# Patient Record
Sex: Female | Born: 1935 | Race: White | Hispanic: No | Marital: Single | Smoking: Former smoker
Health system: Southern US, Community
[De-identification: ages and names within clinical notes are randomized; demographics above are authoritative.]

## PROBLEM LIST (undated history)

## (undated) DIAGNOSIS — I82409 Acute embolism and thrombosis of unspecified deep veins of unspecified lower extremity: Secondary | ICD-10-CM

## (undated) DIAGNOSIS — B029 Zoster without complications: Secondary | ICD-10-CM

## (undated) DIAGNOSIS — E7800 Pure hypercholesterolemia, unspecified: Secondary | ICD-10-CM

## (undated) DIAGNOSIS — I509 Heart failure, unspecified: Secondary | ICD-10-CM

## (undated) HISTORY — PX: COLON SURGERY: SHX602

## (undated) HISTORY — PX: THROAT SURGERY: SHX803

## (undated) HISTORY — PX: MASTECTOMY: SHX3

---

## 2014-10-30 DIAGNOSIS — B029 Zoster without complications: Secondary | ICD-10-CM

## 2014-10-30 HISTORY — DX: Zoster without complications: B02.9

## 2016-06-12 ENCOUNTER — Emergency Department (HOSPITAL_COMMUNITY): Payer: Medicare PPO

## 2016-06-12 ENCOUNTER — Encounter (HOSPITAL_COMMUNITY): Payer: Self-pay | Admitting: Emergency Medicine

## 2016-06-12 ENCOUNTER — Emergency Department (HOSPITAL_COMMUNITY)
Admission: EM | Admit: 2016-06-12 | Discharge: 2016-06-12 | Disposition: A | Discharge status: Home / Self Care | Payer: Medicare PPO | Attending: Emergency Medicine | Admitting: Emergency Medicine

## 2016-06-12 DIAGNOSIS — S0990XA Unspecified injury of head, initial encounter: Secondary | ICD-10-CM | POA: Diagnosis present

## 2016-06-12 DIAGNOSIS — Y999 Unspecified external cause status: Secondary | ICD-10-CM | POA: Insufficient documentation

## 2016-06-12 DIAGNOSIS — I509 Heart failure, unspecified: Secondary | ICD-10-CM | POA: Diagnosis not present

## 2016-06-12 DIAGNOSIS — Z7901 Long term (current) use of anticoagulants: Secondary | ICD-10-CM | POA: Insufficient documentation

## 2016-06-12 DIAGNOSIS — Y9389 Activity, other specified: Secondary | ICD-10-CM | POA: Insufficient documentation

## 2016-06-12 DIAGNOSIS — Y929 Unspecified place or not applicable: Secondary | ICD-10-CM | POA: Insufficient documentation

## 2016-06-12 DIAGNOSIS — Z87891 Personal history of nicotine dependence: Secondary | ICD-10-CM | POA: Diagnosis not present

## 2016-06-12 DIAGNOSIS — S0083XA Contusion of other part of head, initial encounter: Secondary | ICD-10-CM | POA: Diagnosis not present

## 2016-06-12 DIAGNOSIS — Z79899 Other long term (current) drug therapy: Secondary | ICD-10-CM | POA: Insufficient documentation

## 2016-06-12 DIAGNOSIS — W07XXXA Fall from chair, initial encounter: Secondary | ICD-10-CM | POA: Diagnosis not present

## 2016-06-12 HISTORY — DX: Zoster without complications: B02.9

## 2016-06-12 HISTORY — DX: Acute embolism and thrombosis of unspecified deep veins of unspecified lower extremity: I82.409

## 2016-06-12 HISTORY — DX: Heart failure, unspecified: I50.9

## 2016-06-12 HISTORY — DX: Pure hypercholesterolemia, unspecified: E78.00

## 2016-06-12 NOTE — ED Notes (Signed)
Assisted pt to bedside commode in room. Pt normally ambulatory w/ walker or assistance but does c/o new right foot pain when attempting to bear weight while ambulating.

## 2016-06-12 NOTE — ED Triage Notes (Signed)
Brought by EMS from home after falling forward from recliner chair.  Landed face first on hardwood floor.  Hematoma noted to forehead.  Denies any pain.  Pt is on coumadin.  [id number].

## 2016-06-12 NOTE — ED Provider Notes (Signed)
[facility]-EMERGENCY DEPT Provider Note   CSN: [id number] Arrival date & time: 06/12/16  [id number]  First Provider Contact:  First MD Initiated Contact with Patient 06/12/16 [phone]        History   Chief Complaint Chief Complaint  Patient presents with  . Fall    HPI Olivia Beasley is a 82 y.o. female.  HPI   82 year old female presenting after mechanical fall. She is getting up from her recliner this morning. She leaned forward and then subsequently fell forward. She discharge her head. No loss of consciousness. She does have a hematoma to her forehead. She denies any significant pain though. She is on Coumadin for history of DVT. No acute neurological complaints. No nausea or vomiting. Her daughter is at bedside and reports that patient is at baseline mental status. Past Medical History:  Diagnosis Date  . CHF (congestive heart failure) (HCC)   . DVT (deep venous thrombosis) (HCC)   . Hypercholesterolemia   . Shingles 2016    There are no active problems to display for this patient.   Past Surgical History:  Procedure Laterality Date  . COLON SURGERY     remove diverticulum  . MASTECTOMY Left   . THROAT SURGERY      OB History    No data available       Home Medications    Prior to Admission medications   Medication Sig Start Date End Date Taking? Authorizing Provider  carbidopa-levodopa (SINEMET IR) 25-100 MG tablet Take 1 tablet by mouth 2 (two) times daily. 05/01/16  Yes Historical Provider, MD  furosemide (LASIX) 20 MG tablet Take 20 mg by mouth daily. 05/21/16  Yes Historical Provider, MD  lidocaine (LIDODERM) 5 % Place 1 patch onto the skin daily. Remove & Discard patch within 12 hours or as directed by MD   Yes Historical Provider, MD  LYRICA 50 MG capsule Take 50 mg by mouth daily. 06/06/16  Yes Historical Provider, MD  Multiple Vitamins-Minerals (PRESERVISION AREDS 2 PO) Take 1 tablet by mouth daily.   Yes Historical Provider, MD  oxyCODONE-acetaminophen  (PERCOCET/ROXICET) 5-325 MG tablet Take 1 tablet by mouth every 6 (six) hours as needed for moderate pain.  05/26/16  Yes Historical Provider, MD  potassium chloride (K-DUR) 10 MEQ tablet Take 10 mEq by mouth daily. 05/22/16  Yes Historical Provider, MD  SPIRIVA RESPIMAT 2.5 MCG/ACT AERS Inhale 1 puff into the lungs daily.  05/25/16  Yes Historical Provider, MD  warfarin (COUMADIN) 2 MG tablet Take 3 mg by mouth daily. 05/27/16  Yes Historical Provider, MD    Family History No family history on file.  Social History Social History  Substance Use Topics  . Smoking status: Former Smoker    Years: 12.00  . Smokeless tobacco: Not on file  . Alcohol use No     Allergies   Gabapentin and Penicillins   Review of Systems Review of Systems  All systems reviewed and negative, other than as noted in HPI.  Physical Exam Updated Vital Signs BP 116/60   Pulse 79   Temp 97.9 F (36.6 C) (Oral)   Resp 21   Ht 5' (1.524 m)   Wt 154 lb (69.9 kg)   SpO2 95%   BMI 30.08 kg/m   Physical Exam  Constitutional: She appears well-developed and well-nourished. No distress.  HENT:  Head: Normocephalic.  Hematoma to left forehead. No scalp or facial tenderness otherwise.  Eyes: Conjunctivae are normal.  Neck: Neck supple.  Cardiovascular: Normal rate  and regular rhythm.   No murmur heard. Pulmonary/Chest: Effort normal and breath sounds normal. No respiratory distress.  Abdominal: Soft. There is no tenderness.  Musculoskeletal: She exhibits no edema.  No midline spinal tenderness.  Neurological: She is alert.  Skin: Skin is warm and dry.  Stasis changes to bilateral shins.  Psychiatric: She has a normal mood and affect.  Nursing note and vitals reviewed.    ED Treatments / Results  Labs (all labs ordered are listed, but only abnormal results are displayed) Labs Reviewed - No data to display  EKG  EKG Interpretation None       Radiology Ct Head Wo Contrast  Result Date:  06/12/2016 CLINICAL DATA:  Fall out of recliner, struck head on hardwood floor, forehead hematoma, on Coumadin EXAM: CT HEAD WITHOUT CONTRAST TECHNIQUE: Contiguous axial images were obtained from the base of the skull through the vertex without intravenous contrast. COMPARISON:  None. FINDINGS: No evidence of parenchymal hemorrhage or extra-axial fluid collection. No mass lesion, mass effect, or midline shift. No CT evidence of acute infarction. Subcortical white matter and periventricular small vessel ischemic changes. Mild cortical atrophy.  No ventriculomegaly. The visualized paranasal sinuses are essentially clear. The mastoid air cells are unopacified. Mild soft tissue swelling/hematoma overlying the left frontal bone (series 2/ image 20). No evidence of calvarial fracture. IMPRESSION: Mild soft tissue swelling/hematoma overlying the left frontal bone. No evidence of calvarial fracture. No evidence of acute intracranial abnormality. Atrophy with small vessel ischemic changes. Electronically Signed   By: Charline BillsSriyesh  Krishnan M.D.   On: 06/12/2016 08:33    Procedures Procedures (including critical care time)  Medications Ordered in ED Medications - No data to display   Initial Impression / Assessment and Plan / ED Course  I have reviewed the triage vital signs and the nursing notes.  Pertinent labs & imaging results that were available during my care of the patient were reviewed by me and considered in my medical decision making (see chart for details).  Clinical Course   82 year old female presented after mechanical fall. Discharge her head. No LOC. Reassuring exam. No acute neurological complaints. She is on Coumadin though, so CT the head was done. Does not show any bony abnormality or evidence of acute intracranial injury. I feel she is stable for discharge at this time. Return precautions discussed with patient and daughter.  Final Clinical Impressions(s) / ED Diagnoses   Final diagnoses:    Facial contusion, initial encounter  Anticoagulated    New Prescriptions New Prescriptions   No medications on file     Raeford RazorStephen Hulbert Branscome, MD 06/12/16 [phone]

## 2017-04-29 DEATH — deceased

## 2018-03-27 IMAGING — CT CT HEAD W/O CM
4 series · 16 of 47 positions shown, 18 images · non-contrast
Comparison: None.

CLINICAL DATA: Fall out of recliner, struck head on hardwood floor,
forehead hematoma, on Coumadin

EXAM:
CT HEAD WITHOUT CONTRAST
TECHNIQUE: Contiguous axial images were obtained from the base of the skull
through the vertex without intravenous contrast.

[Series 2: head without · axial · non-contrast · 0.44mm/px · z∈[-180,-65]mm · 7 of 31 slices shown, 9 images]
[im 4/31  brain]
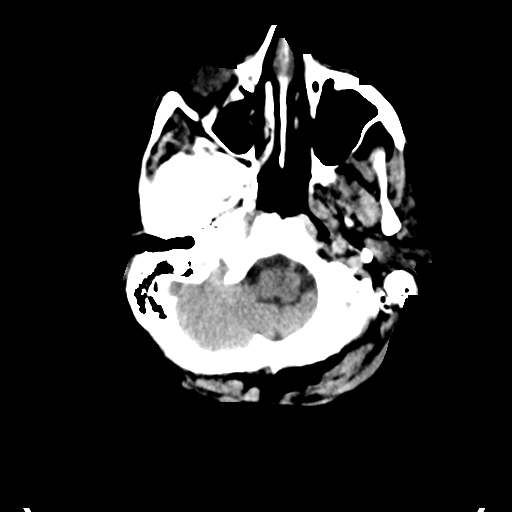
[im 4/31  bone]
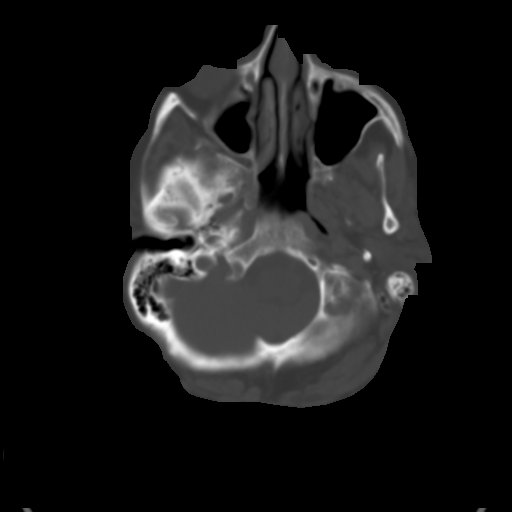
[im 8/31  brain]
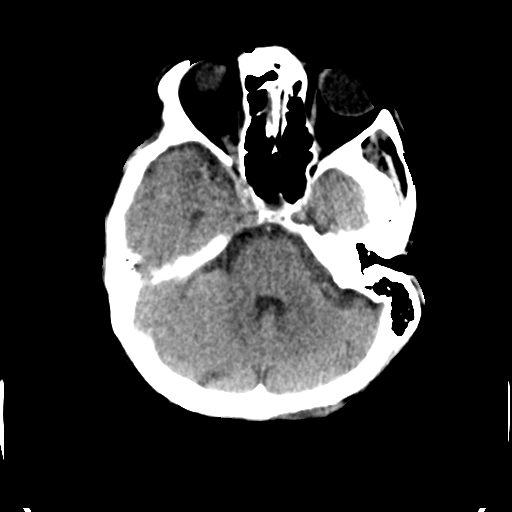
[im 12/31  brain]
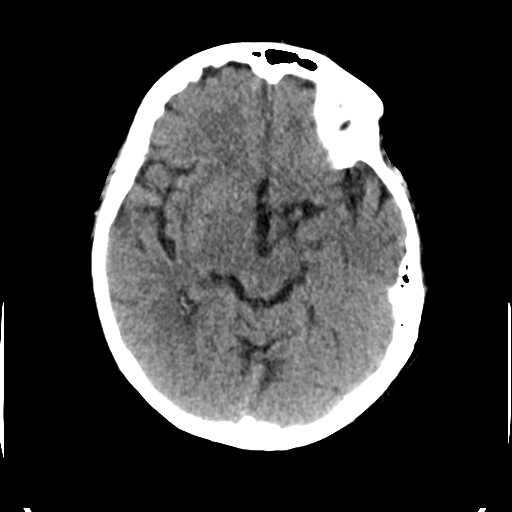
[im 16/31  brain]
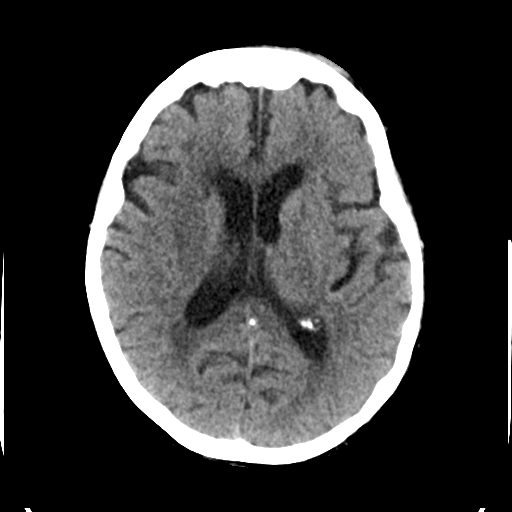
[im 19/31  brain]
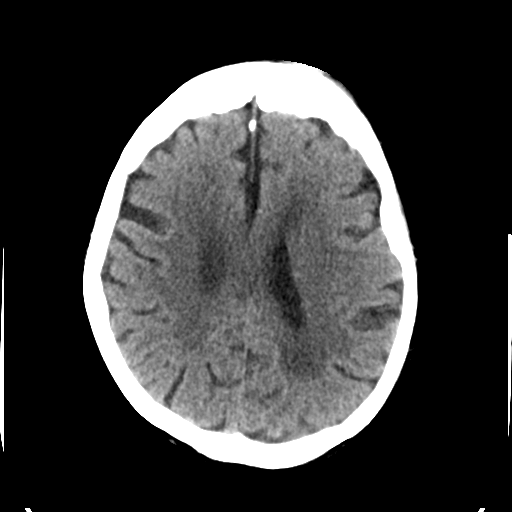
[im 19/31  bone]
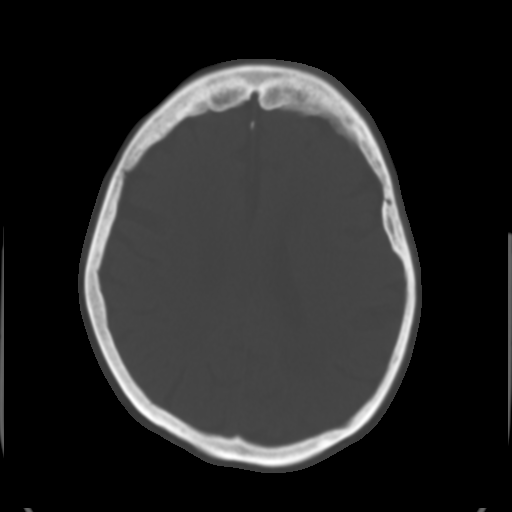
[im 23/31  brain]
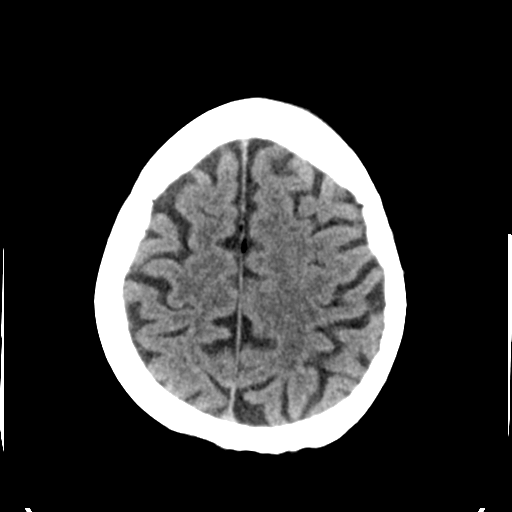
[im 27/31  brain]
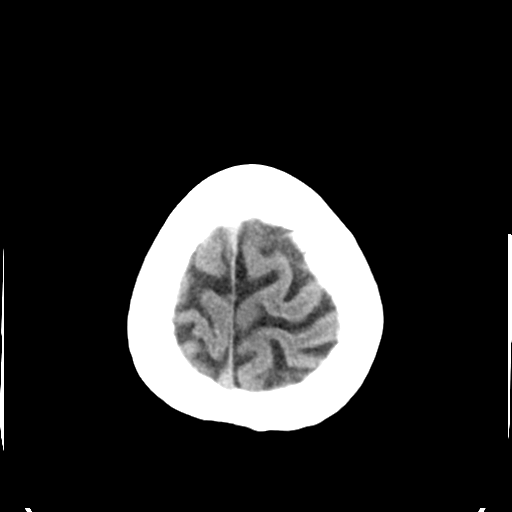

[Series 3: head bone · axial · 0.44mm/px · z∈[-181,-149]mm · 3 of 78 slices shown]
[im 8/78  bone]
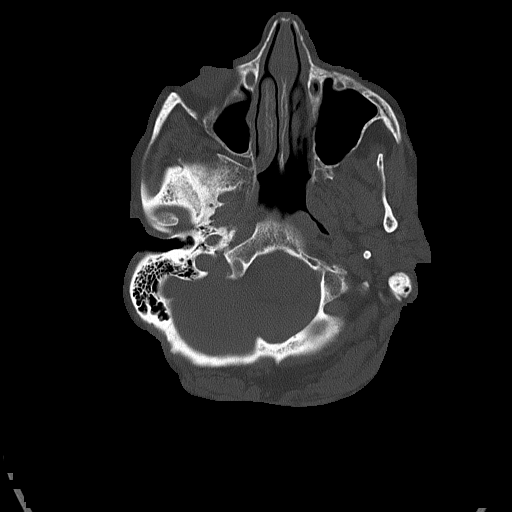
[im 16/78  bone]
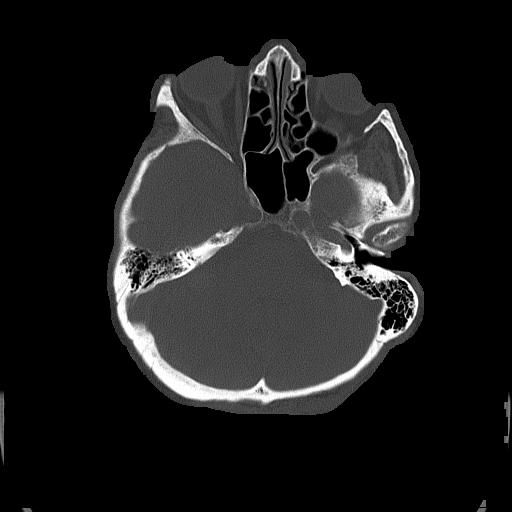
[im 24/78  bone]
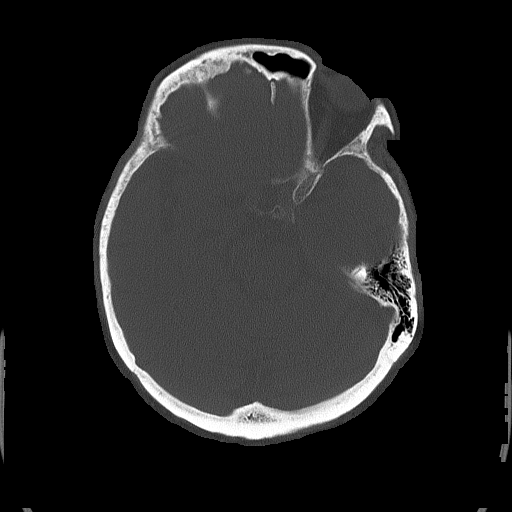

[Series 5: head without sag · sagittal · non-contrast · 0.32mm/px · 3 of 50 slices shown]
[im 17/50  brain]
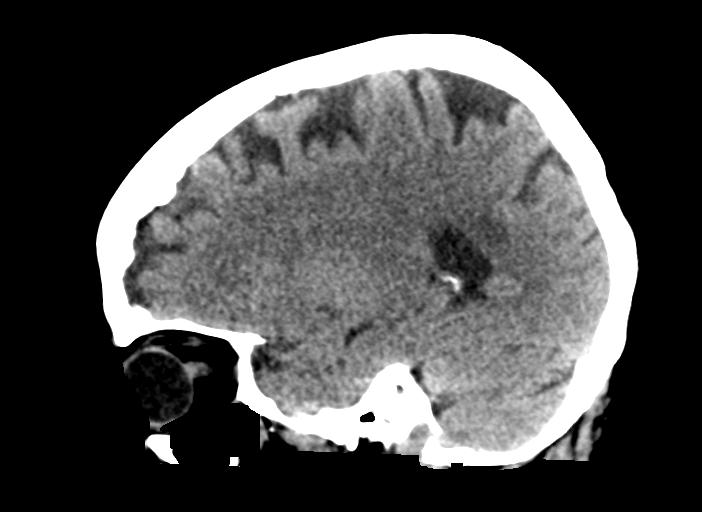
[im 25/50  brain]
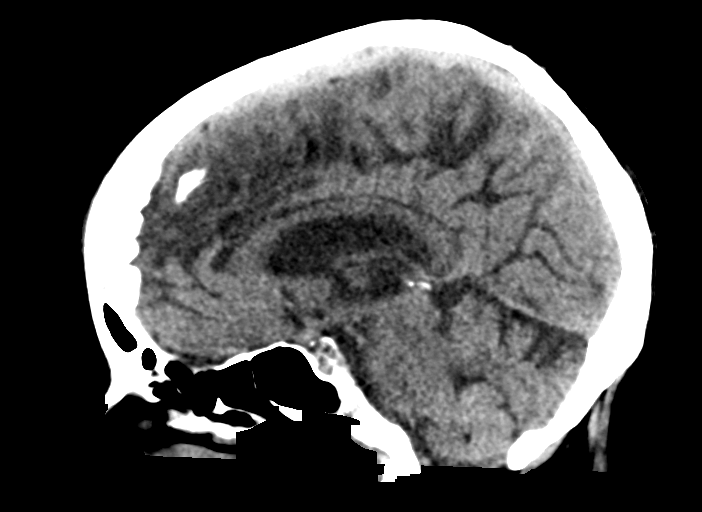
[im 33/50  brain]
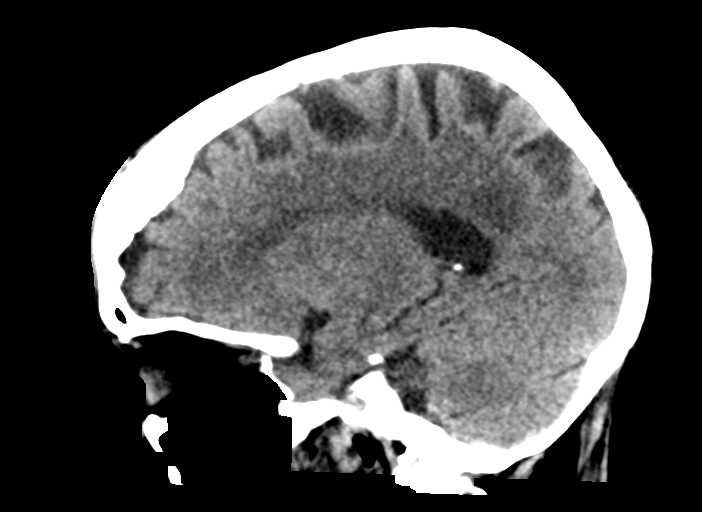

[Series 6: head without cor · coronal · non-contrast · 0.32mm/px · 3 of 62 slices shown]
[im 21/62  brain]
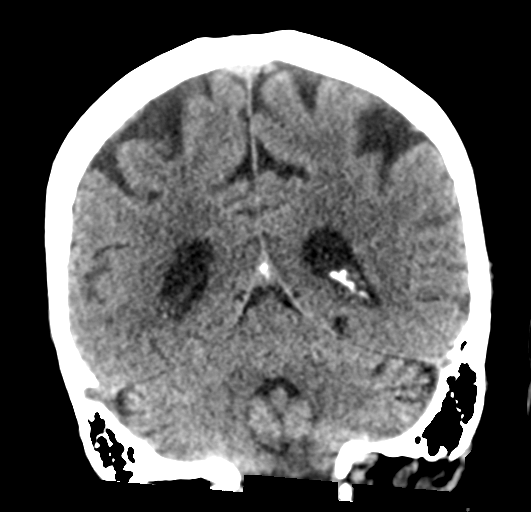
[im 28/62  brain]
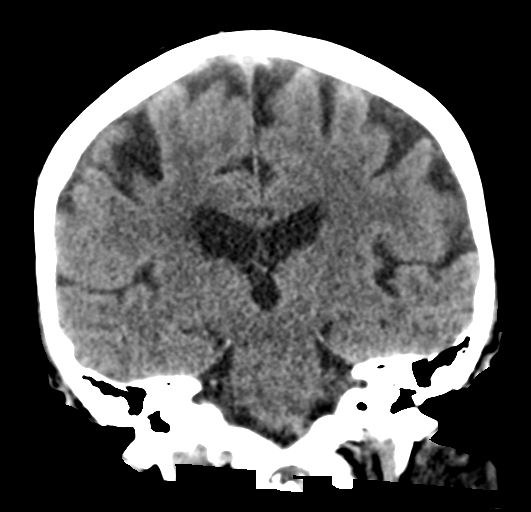
[im 34/62  brain]
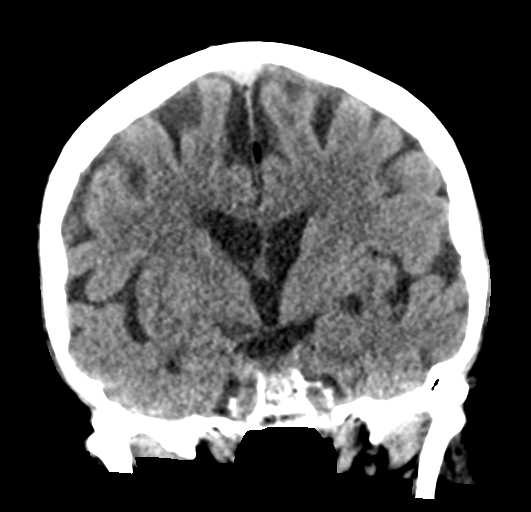

[16 of 47 positions shown; findings below may reference images not displayed]

FINDINGS: No evidence of parenchymal hemorrhage or extra-axial fluid
collection. No mass lesion, mass effect, or midline shift.

No CT evidence of acute infarction.

Subcortical white matter and periventricular small vessel ischemic
changes.

Mild cortical atrophy.  No ventriculomegaly.

The visualized paranasal sinuses are essentially clear. The mastoid
air cells are unopacified.

Mild soft tissue swelling/hematoma overlying the left frontal bone
(series 2/ image 20).

No evidence of calvarial fracture.
IMPRESSION: Mild soft tissue swelling/hematoma overlying the left frontal bone.
No evidence of calvarial fracture.

No evidence of acute intracranial abnormality.

Atrophy with small vessel ischemic changes.
